# Patient Record
Sex: Male | Born: 1990 | Race: Black or African American | Hispanic: No | Marital: Single
Health system: Southern US, Community
[De-identification: ages and names within clinical notes are randomized; demographics above are authoritative.]

---

## 2015-03-16 ENCOUNTER — Encounter (HOSPITAL_COMMUNITY): Payer: Self-pay | Admitting: Emergency Medicine

## 2015-03-16 ENCOUNTER — Emergency Department (HOSPITAL_COMMUNITY): Payer: Self-pay

## 2015-03-16 ENCOUNTER — Emergency Department (HOSPITAL_COMMUNITY)
Admission: EM | Admit: 2015-03-16 | Discharge: 2015-03-16 | Disposition: A | Discharge status: Home / Self Care | Payer: Self-pay | Attending: Emergency Medicine | Admitting: Emergency Medicine

## 2015-03-16 DIAGNOSIS — Y93E5 Activity, floor mopping and cleaning: Secondary | ICD-10-CM | POA: Insufficient documentation

## 2015-03-16 DIAGNOSIS — W19XXXA Unspecified fall, initial encounter: Secondary | ICD-10-CM

## 2015-03-16 DIAGNOSIS — S0990XA Unspecified injury of head, initial encounter: Secondary | ICD-10-CM | POA: Insufficient documentation

## 2015-03-16 DIAGNOSIS — S52124A Nondisplaced fracture of head of right radius, initial encounter for closed fracture: Secondary | ICD-10-CM | POA: Insufficient documentation

## 2015-03-16 DIAGNOSIS — Y92009 Unspecified place in unspecified non-institutional (private) residence as the place of occurrence of the external cause: Secondary | ICD-10-CM | POA: Insufficient documentation

## 2015-03-16 DIAGNOSIS — W11XXXA Fall on and from ladder, initial encounter: Secondary | ICD-10-CM | POA: Insufficient documentation

## 2015-03-16 DIAGNOSIS — Y998 Other external cause status: Secondary | ICD-10-CM | POA: Insufficient documentation

## 2015-03-16 DIAGNOSIS — T1490XA Injury, unspecified, initial encounter: Secondary | ICD-10-CM

## 2015-03-16 DIAGNOSIS — S6991XA Unspecified injury of right wrist, hand and finger(s), initial encounter: Secondary | ICD-10-CM | POA: Insufficient documentation

## 2015-03-16 DIAGNOSIS — S52121A Displaced fracture of head of right radius, initial encounter for closed fracture: Secondary | ICD-10-CM

## 2015-03-16 LAB — COMPREHENSIVE METABOLIC PANEL
ALBUMIN: 4.5 g/dL (ref 3.5–5.0)
ALK PHOS: 65 U/L (ref 38–126)
ALT: 15 U/L — AB (ref 17–63)
AST: 19 U/L (ref 15–41)
Anion gap: 11 (ref 5–15)
BUN: 15 mg/dL (ref 6–20)
CALCIUM: 9.7 mg/dL (ref 8.9–10.3)
CHLORIDE: 105 mmol/L (ref 101–111)
CO2: 25 mmol/L (ref 22–32)
CREATININE: 1.01 mg/dL (ref 0.61–1.24)
GFR calc non Af Amer: >60 mL/min (ref >60)
GLUCOSE: 98 mg/dL (ref 65–99)
Potassium: 3.6 mmol/L (ref 3.5–5.1)
SODIUM: 141 mmol/L (ref 135–145)
Total Bilirubin: 0.8 mg/dL (ref 0.3–1.2)
Total Protein: 7.4 g/dL (ref 6.5–8.1)

## 2015-03-16 LAB — CBC WITH DIFFERENTIAL/PLATELET
BASOS PCT: 0 %
Basophils Absolute: 0 10*3/uL (ref 0.0–0.1)
Eosinophils Absolute: 0.1 10*3/uL (ref 0.0–0.7)
Eosinophils Relative: 1 %
HEMATOCRIT: 41.4 % (ref 39.0–52.0)
HEMOGLOBIN: 14.3 g/dL (ref 13.0–17.0)
LYMPHS PCT: 26 %
Lymphs Abs: 3 10*3/uL (ref 0.7–4.0)
MCH: 29.2 pg (ref 26.0–34.0)
MCHC: 34.5 g/dL (ref 30.0–36.0)
MCV: 84.5 fL (ref 78.0–100.0)
MONO ABS: 0.8 10*3/uL (ref 0.1–1.0)
MONOS PCT: 7 %
NEUTROS ABS: 7.6 10*3/uL (ref 1.7–7.7)
NEUTROS PCT: 66 %
Platelets: 295 10*3/uL (ref 150–400)
RBC: 4.9 MIL/uL (ref 4.22–5.81)
RDW: 12.3 % (ref 11.5–15.5)
WBC: 11.5 10*3/uL — ABNORMAL HIGH (ref 4.0–10.5)

## 2015-03-16 LAB — SAMPLE TO BLOOD BANK

## 2015-03-16 LAB — ETHANOL: Alcohol, Ethyl (B): <5 mg/dL (ref <5)

## 2015-03-16 MED ORDER — IBUPROFEN 600 MG PO TABS: 600.0000 mg | ORAL_TABLET | Freq: Four times a day (QID) | ORAL | Status: AC | PRN

## 2015-03-16 MED ORDER — HYDROCODONE-ACETAMINOPHEN 5-325 MG PO TABS: 1.0000 | ORAL_TABLET | Freq: Four times a day (QID) | ORAL | Status: AC | PRN

## 2015-03-16 MED ORDER — FENTANYL CITRATE (PF) 100 MCG/2ML IJ SOLN
50.0000 ug | Freq: Once | INTRAMUSCULAR | Status: AC
  Administered 2015-03-16: 50 ug via INTRAVENOUS
  Filled 2015-03-16: qty 2

## 2015-03-16 NOTE — ED Notes (Signed)
Pt fell from a 1 story house roof while cleaning the gutters. Believes he didn't hit his head, no LOC. Complaining of right arm pain. Able to move all extremities except right arm/elbow. Sensation and pulse intact in extremity

## 2015-03-16 NOTE — ED Provider Notes (Signed)
CSN: [id number]     Arrival date & time 03/16/15  2018 History   First MD Initiated Contact with Patient 03/16/15 2034     Chief Complaint  Patient presents with  . Fall     (Consider location/radiation/quality/duration/timing/severity/associated sxs/prior Treatment) HPI   24 year old male who presents with fall. Otherwise healthy. Cleaning gutters on top of a house today when he lost balance and fell. Height of about 12 ft. States he landed on his right arm, and does not think he hit his head but having headache. No LOC, N/V. No chest pain, difficulty breathing, back pain, neck pain, abdominal pain. C/o pain in his right elbow and wrist.  History reviewed. No pertinent past medical history. History reviewed. No pertinent past surgical history. History reviewed. No pertinent family history. Social History  Substance Use Topics  . Smoking status: None  . Smokeless tobacco: None  . Alcohol Use: None    Review of Systems 10/14 systems reviewed and are negative other than those stated in the HPI    Allergies  Review of patient's allergies indicates no known allergies.  Home Medications   Prior to Admission medications   Medication Sig Start Date End Date Taking? Authorizing Provider  HYDROcodone-acetaminophen (NORCO/VICODIN) 5-325 MG tablet Take 1-2 tablets by mouth every 6 (six) hours as needed for moderate pain or severe pain. 03/16/15   Lavera Guise, MD  ibuprofen (ADVIL,MOTRIN) 600 MG tablet Take 1 tablet (600 mg total) by mouth every 6 (six) hours as needed for mild pain or moderate pain. 03/16/15   Lavera Guise, MD   BP 139/94 mmHg  Pulse 74  Temp(Src) 98.7 F (37.1 C) (Oral)  Resp 20  SpO2 99% Physical Exam  Nursing note and vitals reviewed.  PRIMARY SURVEY:   AIRWAY and C-SPINE - Patient talking. No stridor, hoarseness, gurgling. No foreign bodies, pooled secretions or blood visualized in the oral cavity. Cervical spine collar in place. BREATHING - Trachea not  deviated. No obvious subcutaneous emphysema. No obvious chest wounds or flail chest. Equal breath sounds bilaterally. CIRCULATION - Extremities well  perfused bilaterally. DISABILITY/NEUROLOGICAL - Glasgow Coma Scale: 15 EXPOSURE: Mild soft tissue swelling involving the right elbow     SECONDARY SURVEY:   VITAL SIGNS: Reviewed in EMR and at the bedside.   HEAD/ENT: no palpable skull depression, hematoma, edema or lacerations. Otoscopy revealed no blood or CSF in the external auditory canal and no hemotympanum. No nasal septal hematoma.   EYES: Pupils 3 mm in size bilaterally, round, reactive to light. Extraocular movement normal.  NECK: No obvious trauma. C-spine collar remains in place.   NEUROLOGICAL: Alert and oriented to person, location, time and situation. Moves all extremities. Motor strength and sensation to soft touch are equal bilaterally in the upper and lower extremities.   CARDIOVASCULAR/CIRCULATION: Regular rhythm, no murmur appreciated. Distal pulses palpable.   CHEST: Clear to auscultation bilaterally. no tenderness or skin abnormalities.   ABDOMEN: Normal bowel sounds.  non distended, no tenderness to palpation. no guarding or rebound tenderness. no skin abnormalities.   GENITOURINARY: no blood at the meatus. no obvious signs of trauma.   LOCOMOTOR SYSTEM/SKIN: Mild soft tissue swelling of the right elbow with tenderness w/ ROM. Extremities with no obvious deformities, hematoma, edema or lacerations. no abrasions. Pelvis stable. Patient completely disrobed. Patient logrolled. Inspection of back notable for no obvious signs of injury, palpation of back elicits no tenderness or step-offs.     ED Course  Procedures (including critical  care time) Labs Review Labs Reviewed  COMPREHENSIVE METABOLIC PANEL - Abnormal; Notable for the following:    ALT 15 (*)    All other components within normal limits  CBC WITH DIFFERENTIAL/PLATELET - Abnormal; Notable for the following:     WBC 11.5 (*)    All other components within normal limits  ETHANOL  SAMPLE TO BLOOD BANK    Imaging Review Dg Chest 1 View  03/16/2015  CLINICAL DATA:  Initial encounter for fell from 1 story house while cleaning gutters. Right arm pain. EXAM: CHEST 1 VIEW COMPARISON:  None. FINDINGS: The lungs are clear wiithout focal pneumonia, edema, pneumothorax or pleural effusion. The cardiopericardial silhouette is within normal limits for size. Imaged bony structures of the thorax are intact. Telemetry leads overlie the chest. IMPRESSION: Normal exam. Electronically Signed   By: Kennith CenterEric  Mansell M.D.   On: 03/16/2015 21:44   Dg Pelvis 1-2 Views  03/16/2015  CLINICAL DATA:  24 year old male with fall EXAM: PELVIS - 1-2 VIEW COMPARISON:  None. FINDINGS: There is no evidence of pelvic fracture or diastasis. No pelvic bone lesions are seen. IMPRESSION: Negative. Electronically Signed   By: Elgie CollardArash  Radparvar M.D.   On: 03/16/2015 21:44   Dg Elbow Complete Right  03/16/2015  CLINICAL DATA:  Right arm pain after a fall. EXAM: RIGHT ELBOW - COMPLETE 3+ VIEW COMPARISON:  None. FINDINGS: Suboptimal patient positioning, secondary to limited range of motion. Joint effusion, as evidenced by elevation of the anterior fat pad and presence of a posterior fat pad. Intra-articular minimally displaced fracture of the radial head. IMPRESSION: Suboptimal patient positioning. Intra-articular radial head fracture with joint effusion. Electronically Signed   By: Jeronimo GreavesKyle  Talbot M.D.   On: 03/16/2015 21:44   Dg Wrist Complete Right  03/16/2015  CLINICAL DATA:  Initial encounter for fall from roof while cleaning gutters. Right arm pain. Right wrist pain. EXAM: RIGHT WRIST - COMPLETE 3+ VIEW COMPARISON:  None. FINDINGS: Four views study somewhat limited by positioning, but no fracture is evident. No evidence for subluxation or dislocation. No worrisome lytic or sclerotic osseous abnormality. IMPRESSION: Negative. Electronically  Signed   By: Kennith CenterEric  Mansell M.D.   On: 03/16/2015 21:46   Ct Head Wo Contrast  03/16/2015  CLINICAL DATA:  Trauma.  Fell off roof. EXAM: CT HEAD WITHOUT CONTRAST CT CERVICAL SPINE WITHOUT CONTRAST TECHNIQUE: Multidetector CT imaging of the head and cervical spine was performed following the standard protocol without intravenous contrast. Multiplanar CT image reconstructions of the cervical spine were also generated. COMPARISON:  None. FINDINGS: CT HEAD FINDINGS Sinuses/Soft tissues: No significant soft tissue swelling. Mucosal thickening of bilateral maxillary sinuses. Minimal ethmoid air cell mucosal thickening. No skull fracture. Intracranial: No mass lesion, hemorrhage, hydrocephalus, acute infarct, intra-axial, or extra-axial fluid collection. CT CERVICAL SPINE FINDINGS Spinal visualization through the bottom of T2. Prevertebral soft tissues are within normal limits. No apical pneumothorax. Skull base intact. Maintenance of vertebral body height. Straightening of expected lordosis. Facets are well-aligned. Coronal reformats demonstrate a normal C1-C2 articulation. IMPRESSION: 1.  No acute intracranial abnormality. 2. No fracture or subluxation in the cervical spine. Straightening of expected cervical lordosis could be positional, due to muscular spasm, or ligamentous injury. 3. Sinus disease. Electronically Signed   By: Jeronimo GreavesKyle  Talbot M.D.   On: 03/16/2015 21:54   Ct Cervical Spine Wo Contrast  03/16/2015  CLINICAL DATA:  Trauma.  Fell off roof. EXAM: CT HEAD WITHOUT CONTRAST CT CERVICAL SPINE WITHOUT CONTRAST TECHNIQUE: Multidetector CT imaging of  the head and cervical spine was performed following the standard protocol without intravenous contrast. Multiplanar CT image reconstructions of the cervical spine were also generated. COMPARISON:  None. FINDINGS: CT HEAD FINDINGS Sinuses/Soft tissues: No significant soft tissue swelling. Mucosal thickening of bilateral maxillary sinuses. Minimal ethmoid air cell  mucosal thickening. No skull fracture. Intracranial: No mass lesion, hemorrhage, hydrocephalus, acute infarct, intra-axial, or extra-axial fluid collection. CT CERVICAL SPINE FINDINGS Spinal visualization through the bottom of T2. Prevertebral soft tissues are within normal limits. No apical pneumothorax. Skull base intact. Maintenance of vertebral body height. Straightening of expected lordosis. Facets are well-aligned. Coronal reformats demonstrate a normal C1-C2 articulation. IMPRESSION: 1.  No acute intracranial abnormality. 2. No fracture or subluxation in the cervical spine. Straightening of expected cervical lordosis could be positional, due to muscular spasm, or ligamentous injury. 3. Sinus disease. Electronically Signed   By: Jeronimo Greaves M.D.   On: 03/16/2015 21:54   I have personally reviewed and evaluated these images and lab results as part of my medical decision-making.   EKG Interpretation None      MDM   Final diagnoses:  Fall, initial encounter  Radial head fracture, closed, right, initial encounter    24 year old male who presents after falling off a ladder from 12 feet. He is well-appearing and in no acute distress on presentation. Vital signs are stable. He arrives in cervical collar. ABCs are intact, he is neurologically intact, and with GCS of 15. Trauma evaluation notable for only mild soft tissue swelling around the right elbow with complaint of pain with range of motion. Given significant fall CT head and cervical spine are performed, visualized, negative for acute injuries involving the head or cervical spine. Cervical collar is subsequently cleared and he has normal range of motion of his neck without pain. Chest x-ray and pelvic x-ray are negative for acute injuries. X-rays of his right elbow reveals a nondisplaced intra-articular radial head fracture that is closed. Extremity is neurovascularly intact distally. Posterior long arm splint is applied to the right arm.  Discussed close follow-up with orthopedic surgery. Strict return and follow-up instructions reviewed. He expressed understanding of all discharge instructions and felt comfortable to plan of care.    Lavera Guise, MD 03/17/15 [phone]

## 2015-03-16 NOTE — Discharge Instructions (Signed)
Return without fail for worsening symptoms, including worsening pain, numbness/weakness, confusion, or any other symptoms concerning to you.   Cast or Splint Care Casts and splints support injured limbs and keep bones from moving while they heal. It is important to care for your cast or splint at home.  HOME CARE INSTRUCTIONS  Keep the cast or splint uncovered during the drying period. It can take 24 to 48 hours to dry if it is made of plaster. A fiberglass cast will dry in less than 1 hour.  Do not rest the cast on anything harder than a pillow for the first 24 hours.  Do not put weight on your injured limb or apply pressure to the cast until your health care provider gives you permission.  Keep the cast or splint dry. Wet casts or splints can lose their shape and may not support the limb as well. A wet cast that has lost its shape can also create harmful pressure on your skin when it dries. Also, wet skin can become infected.  Cover the cast or splint with a plastic bag when bathing or when out in the rain or snow. If the cast is on the trunk of the body, take sponge baths until the cast is removed.  If your cast does become wet, dry it with a towel or a blow dryer on the cool setting only.  Keep your cast or splint clean. Soiled casts may be wiped with a moistened cloth.  Do not place any hard or soft foreign objects under your cast or splint, such as cotton, toilet paper, lotion, or powder.  Do not try to scratch the skin under the cast with any object. The object could get stuck inside the cast. Also, scratching could lead to an infection. If itching is a problem, use a blow dryer on a cool setting to relieve discomfort.  Do not trim or cut your cast or remove padding from inside of it.  Exercise all joints next to the injury that are not immobilized by the cast or splint. For example, if you have a long leg cast, exercise the hip joint and toes. If you have an arm cast or splint,  exercise the shoulder, elbow, thumb, and fingers.  Elevate your injured arm or leg on 1 or 2 pillows for the first 1 to 3 days to decrease swelling and pain.It is best if you can comfortably elevate your cast so it is higher than your heart. SEEK MEDICAL CARE IF:   Your cast or splint cracks.  Your cast or splint is too tight or too loose.  You have unbearable itching inside the cast.  Your cast becomes wet or develops a soft spot or area.  You have a bad smell coming from inside your cast.  You get an object stuck under your cast.  Your skin around the cast becomes red or raw.  You have new pain or worsening pain after the cast has been applied. SEEK IMMEDIATE MEDICAL CARE IF:   You have fluid leaking through the cast.  You are unable to move your fingers or toes.  You have discolored (blue or white), cool, painful, or very swollen fingers or toes beyond the cast.  You have tingling or numbness around the injured area.  You have severe pain or pressure under the cast.  You have any difficulty with your breathing or have shortness of breath.  You have chest pain.   This information is not intended to replace advice  given to you by your health care provider. Make sure you discuss any questions you have with your health care provider.   Document Released: 02/29/2000 Document Revised: 12/22/2012 Document Reviewed: 09/09/2012 Elsevier Interactive Patient Education 2016 [employer]  Radial Head Fracture A radial head fracture is a break of the smaller bone (radius) in the forearm. The head of this bone is the part near the elbow. These fractures commonly happen during a fall, when you land on an outstretched arm. These fractures are more common in middle aged adults and are common with a dislocation of the elbow. SYMPTOMS   Swelling of the elbow joint and pain on the outside of the elbow.  Pain and difficulty in bending or straightening the elbow.  Pain and difficulty  in turning the palm of the hand up or down with the elbow bent. DIAGNOSIS  Your caregiver may make this diagnosis by a physical exam. X-rays can confirm the type and amount of fracture. Sometimes a fracture that is not displaced cannot be seen on the original X-ray. TREATMENT  Radial head fractures are classified according to the amount of movement (displacement) of parts from the normal position.  Type 1 Fractures  Type 1 fractures are generally small fractures in which bone pieces remain together (nondisplaced fracture).  The fracture may not be seen on initial X-rays. Usually if X-rays are repeated two to three weeks later, the fracture will show up. A splint or sling is used for a few days. Gentle early motion is used to prevent the elbow from becoming stiff. It should not be done vigorously or forced as this could displace the bone pieces. Type 2 Fractures  With type 2 fractures, bone pieces are slightly displaced and larger pieces of bone are broken off.  If only a little displacement of the bone piece is present, splinting for 4 to 5 days usually works well. This is again followed with gentle active range of motion. Small fragments may be surgically removed.  Large pieces of bone that can be put back into place will sometimes be fixed with pins or screws to hold them until the bone is healed. If this cannot be done, the fragments are removed. For older, less active people, sometimes the entire radial head is removed if the wrist is not injured. The elbow and arm will still work fine. Soft tissue, tendon, and ligament injuries are corrected at the same time. Type 3 Fractures  Type 3 fractures have multiple broken pieces of bone that cannot be fixed. Surgery is usually needed to remove the broken bits of bone and what is left of the radial head. Soft-tissue damage is repaired. Gentle early motion is used to prevent the elbow from becoming stiff. Sometimes an artificial radial head can be used  to prevent deformity if the elbow is unstable. Rest, ice, elevation, immobilization, medications, and pain control are used in the early care. HOME CARE INSTRUCTIONS   Keep the injured part elevated while sitting or lying down. Keep the injury above the level of your heart (the center of the chest). This will decrease swelling and pain.  Apply ice to the injury for 15-20 minutes, 03-04 times per day while awake, for 2 days. Put the ice in a plastic bag and place a towel between the bag of ice and your cast or splint.  Move your fingers to avoid stiffness and minimize swelling.  If you have a plaster or fiberglass cast:  Do not try to scratch the skin  under the cast using sharp or pointed objects.  Check the skin around the cast every day. You may put lotion on any red or sore areas.  Keep your cast dry and clean.  If you have a plaster splint:  Wear the splint as directed.  You may loosen the elastic around the splint if your fingers become numb, tingle, or turn cold or blue.  Do not put pressure on any part of your cast or splint. It may break. Rest your cast only on a pillow for the first 24 hours until it is fully hardened.  Your cast or splint can be protected during bathing with a plastic bag. Do not lower the cast or splint into the water.  Only take over-the-counter or prescription medicines for pain, discomfort, or fever as directed by your caregiver.  Follow all instructions for follow-up with your caregiver. This includes any orthopedic referrals, physical therapy, and rehabilitation. Any delay in obtaining necessary care could result in a delay or failure of the bones to heal or permanent elbow stiffness.  Do not overdo exercises. This could further damage your injury. SEEK IMMEDIATE MEDICAL CARE IF:   Your cast or splint gets damaged or breaks.  You have more severe pain or swelling than you did before getting the cast.  You have severe pain when stretching your  fingers.  There is a bad smell, new stains, and/or pus-like (purulent) drainage coming from under the cast.  Your fingers or hand turn pale or blue, become cold, or you lose feeling.   This information is not intended to replace advice given to you by your health care provider. Make sure you discuss any questions you have with your health care provider.   Document Released: 12/23/2005 Document Revised: 03/24/2014 Document Reviewed: 09/13/2014 Elsevier Interactive Patient Education [employer]

## 2016-07-22 IMAGING — CR DG ELBOW COMPLETE 3+V*R*
7 series · 7 of 7 positions shown · non-contrast
Comparison: None.

CLINICAL DATA: Right arm pain after a fall.

EXAM:
RIGHT ELBOW - COMPLETE 3+ VIEW

[x elbow obl right (1 of 5)]
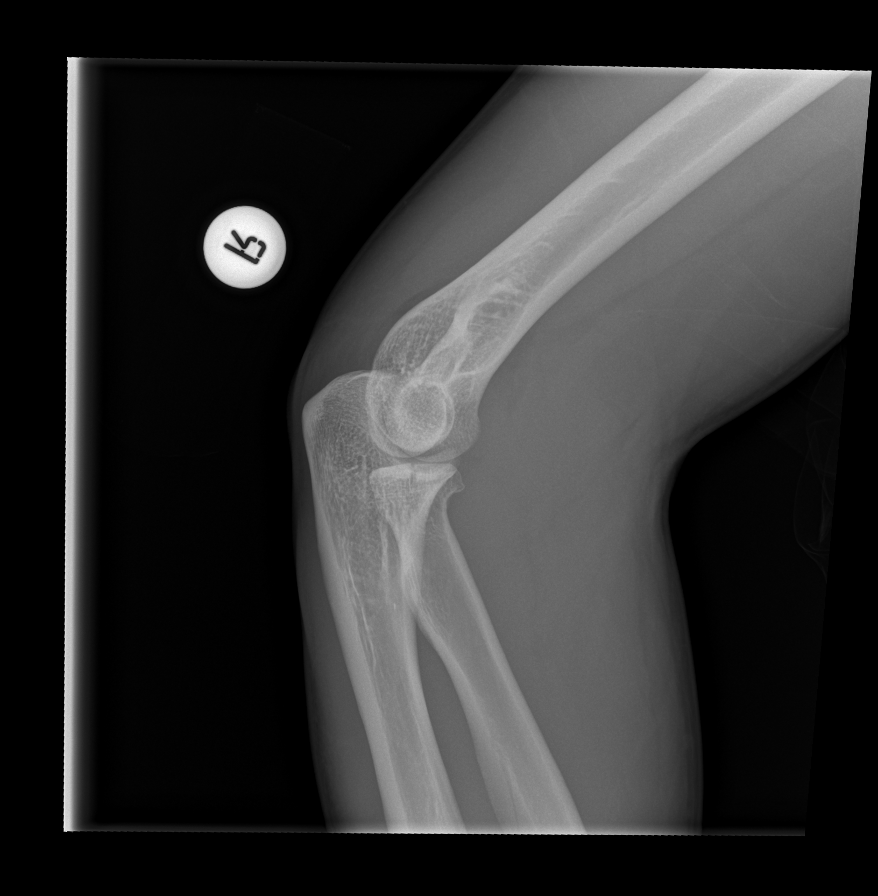

[x elbow obl right (2 of 5)]
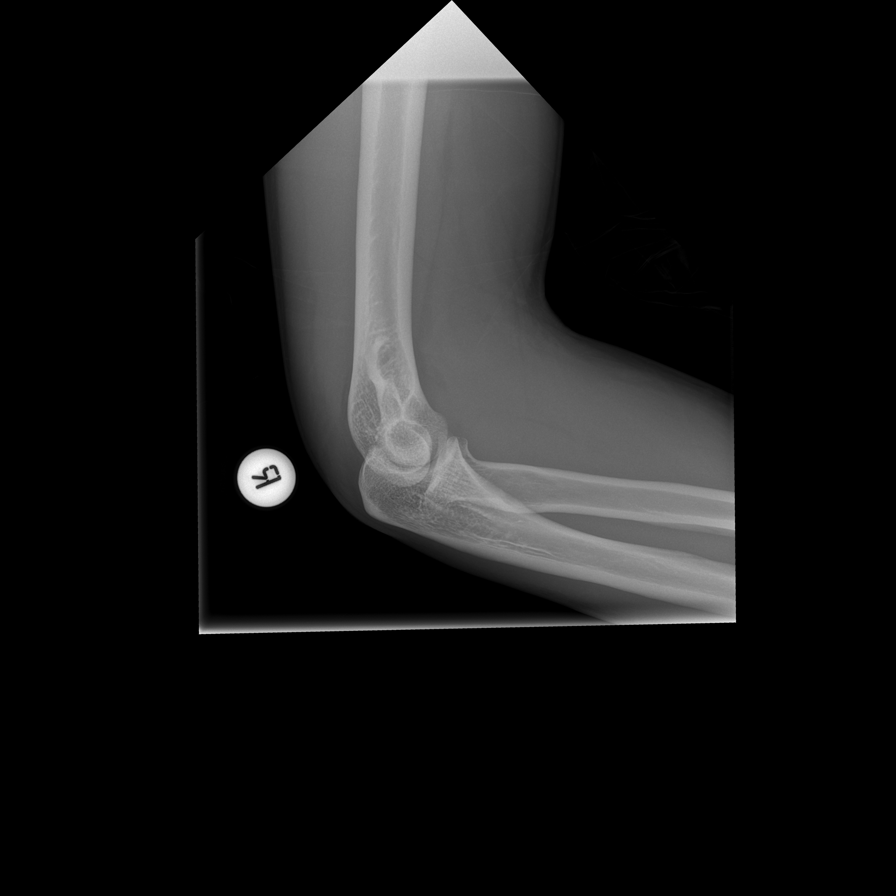

[x elbow lat right]
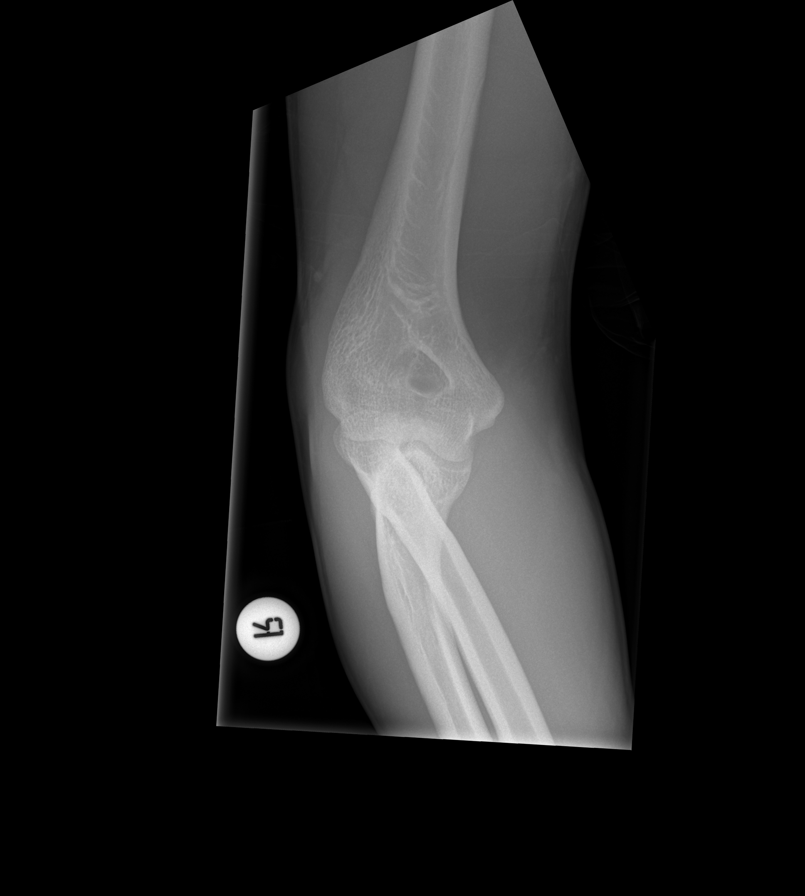

[x elbow obl right (3 of 5)]
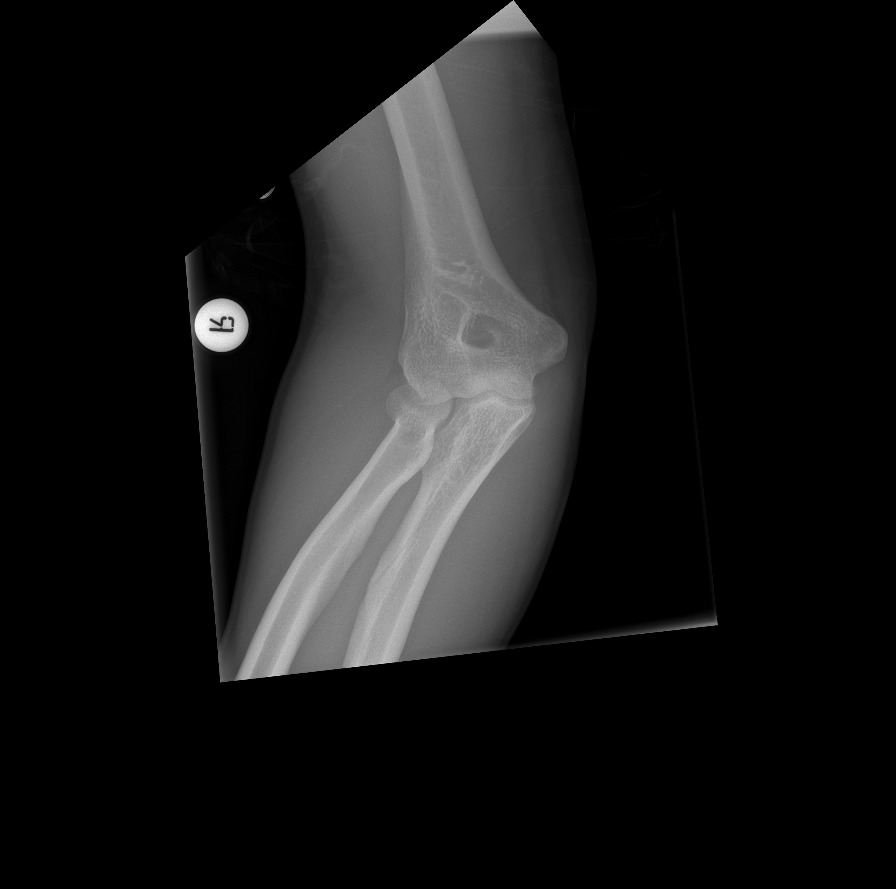

[x elbow ap right]
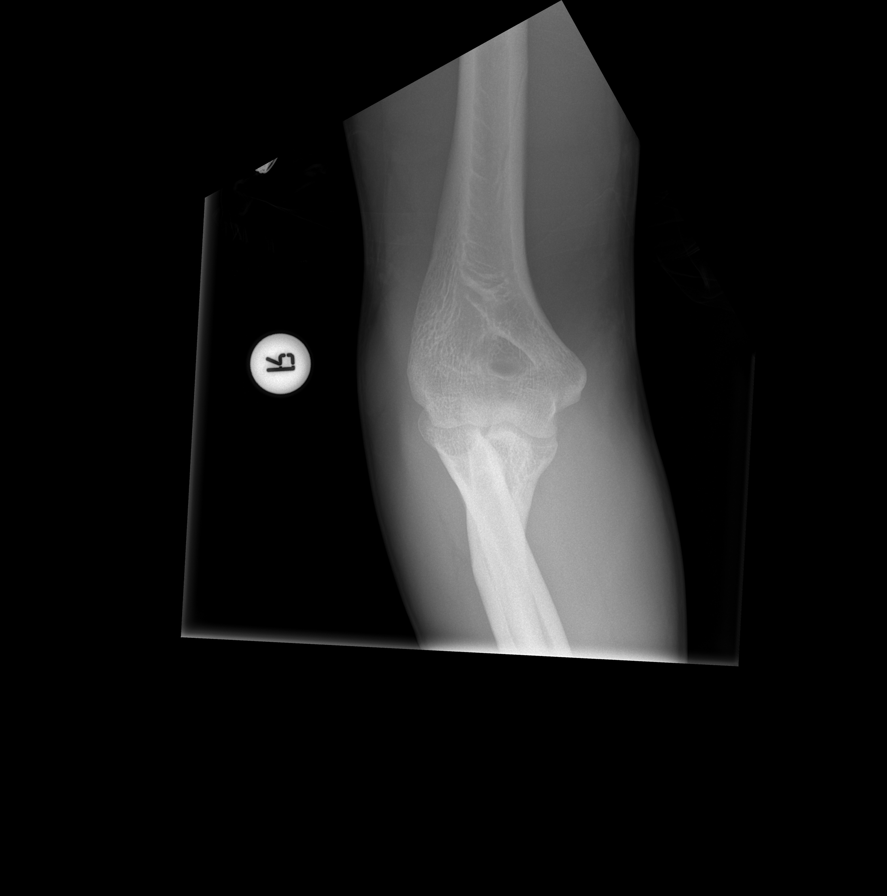

[x elbow obl right (4 of 5)]
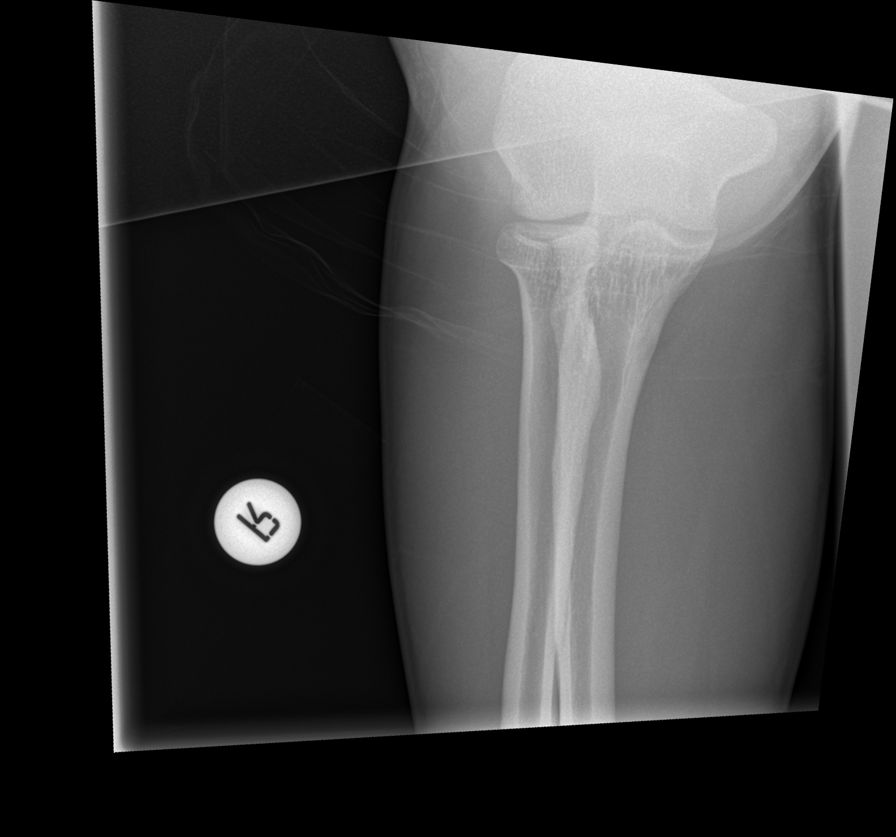

[x elbow obl right (5 of 5)]
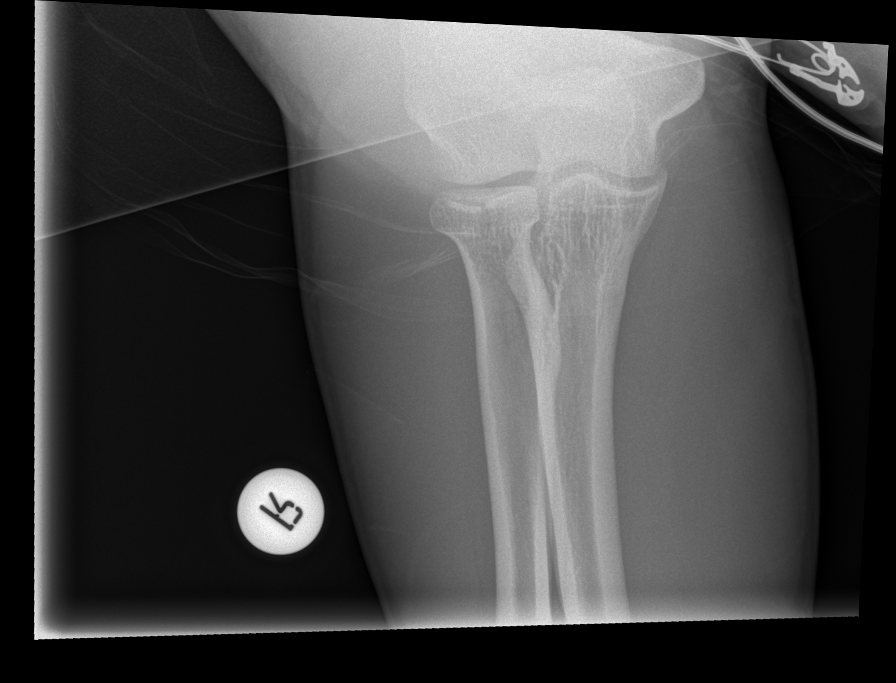

[7 of 7 positions shown; findings below may reference images not displayed]

FINDINGS: Suboptimal patient positioning, secondary to limited range of
motion. Joint effusion, as evidenced by elevation of the anterior
fat pad and presence of a posterior fat pad. Intra-articular
minimally displaced fracture of the radial head.
IMPRESSION: Suboptimal patient positioning.

Intra-articular radial head fracture with joint effusion.
